# Patient Record
Sex: Female | Born: 1935 | Race: White | Hispanic: No | Smoking: Never smoker
Health system: Southern US, Community
[De-identification: ages and names within clinical notes are randomized; demographics above are authoritative.]

## PROBLEM LIST (undated history)

## (undated) DIAGNOSIS — C9111 Chronic lymphocytic leukemia of B-cell type in remission: Secondary | ICD-10-CM

## (undated) DIAGNOSIS — K219 Gastro-esophageal reflux disease without esophagitis: Secondary | ICD-10-CM

## (undated) DIAGNOSIS — R258 Other abnormal involuntary movements: Secondary | ICD-10-CM

## (undated) DIAGNOSIS — I2510 Atherosclerotic heart disease of native coronary artery without angina pectoris: Secondary | ICD-10-CM

## (undated) DIAGNOSIS — L720 Epidermal cyst: Secondary | ICD-10-CM

## (undated) DIAGNOSIS — R278 Other lack of coordination: Secondary | ICD-10-CM

## (undated) DIAGNOSIS — R600 Localized edema: Secondary | ICD-10-CM

## (undated) DIAGNOSIS — E119 Type 2 diabetes mellitus without complications: Secondary | ICD-10-CM

## (undated) DIAGNOSIS — F458 Other somatoform disorders: Secondary | ICD-10-CM

## (undated) DIAGNOSIS — F331 Major depressive disorder, recurrent, moderate: Secondary | ICD-10-CM

## (undated) DIAGNOSIS — I110 Hypertensive heart disease with heart failure: Secondary | ICD-10-CM

## (undated) DIAGNOSIS — R351 Nocturia: Secondary | ICD-10-CM

## (undated) DIAGNOSIS — C50919 Malignant neoplasm of unspecified site of unspecified female breast: Secondary | ICD-10-CM

## (undated) DIAGNOSIS — H409 Unspecified glaucoma: Secondary | ICD-10-CM

## (undated) DIAGNOSIS — E039 Hypothyroidism, unspecified: Secondary | ICD-10-CM

## (undated) DIAGNOSIS — R197 Diarrhea, unspecified: Secondary | ICD-10-CM

## (undated) DIAGNOSIS — L918 Other hypertrophic disorders of the skin: Secondary | ICD-10-CM

## (undated) DIAGNOSIS — L570 Actinic keratosis: Secondary | ICD-10-CM

## (undated) DIAGNOSIS — R350 Frequency of micturition: Secondary | ICD-10-CM

## (undated) DIAGNOSIS — M549 Dorsalgia, unspecified: Secondary | ICD-10-CM

## (undated) DIAGNOSIS — K649 Unspecified hemorrhoids: Secondary | ICD-10-CM

## (undated) DIAGNOSIS — E785 Hyperlipidemia, unspecified: Secondary | ICD-10-CM

## (undated) DIAGNOSIS — R21 Rash and other nonspecific skin eruption: Secondary | ICD-10-CM

## (undated) DIAGNOSIS — L299 Pruritus, unspecified: Secondary | ICD-10-CM

## (undated) DIAGNOSIS — I509 Heart failure, unspecified: Secondary | ICD-10-CM

## (undated) DIAGNOSIS — Z9181 History of falling: Secondary | ICD-10-CM

## (undated) DIAGNOSIS — R2231 Localized swelling, mass and lump, right upper limb: Secondary | ICD-10-CM

## (undated) DIAGNOSIS — K589 Irritable bowel syndrome without diarrhea: Secondary | ICD-10-CM

## (undated) DIAGNOSIS — K5900 Constipation, unspecified: Secondary | ICD-10-CM

## (undated) DIAGNOSIS — I82409 Acute embolism and thrombosis of unspecified deep veins of unspecified lower extremity: Secondary | ICD-10-CM

## (undated) DIAGNOSIS — I10 Essential (primary) hypertension: Secondary | ICD-10-CM

## (undated) DIAGNOSIS — C801 Malignant (primary) neoplasm, unspecified: Secondary | ICD-10-CM

## (undated) DIAGNOSIS — L509 Urticaria, unspecified: Secondary | ICD-10-CM

## (undated) DIAGNOSIS — E559 Vitamin D deficiency, unspecified: Secondary | ICD-10-CM

## (undated) DIAGNOSIS — H9190 Unspecified hearing loss, unspecified ear: Secondary | ICD-10-CM

## (undated) DIAGNOSIS — I4891 Unspecified atrial fibrillation: Secondary | ICD-10-CM

## (undated) DIAGNOSIS — R2681 Unsteadiness on feet: Secondary | ICD-10-CM

## (undated) DIAGNOSIS — B000 Eczema herpeticum: Secondary | ICD-10-CM

## (undated) DIAGNOSIS — O2230 Deep phlebothrombosis in pregnancy, unspecified trimester: Secondary | ICD-10-CM

## (undated) DIAGNOSIS — F419 Anxiety disorder, unspecified: Secondary | ICD-10-CM

## (undated) DIAGNOSIS — H8110 Benign paroxysmal vertigo, unspecified ear: Secondary | ICD-10-CM

## (undated) DIAGNOSIS — K588 Other irritable bowel syndrome: Secondary | ICD-10-CM

## (undated) HISTORY — PX: BREAST LUMPECTOMY: SHX2

## (undated) HISTORY — PX: APPENDECTOMY: SHX54

## (undated) HISTORY — PX: EYE SURGERY: SHX253

---

## 2012-01-08 ENCOUNTER — Encounter (HOSPITAL_BASED_OUTPATIENT_CLINIC_OR_DEPARTMENT_OTHER): Payer: Self-pay | Admitting: *Deleted

## 2012-01-08 ENCOUNTER — Emergency Department (HOSPITAL_BASED_OUTPATIENT_CLINIC_OR_DEPARTMENT_OTHER)
Admission: EM | Admit: 2012-01-08 | Discharge: 2012-01-08 | Disposition: A | Discharge status: Home / Self Care | Payer: Medicare Other | Attending: Emergency Medicine | Admitting: Emergency Medicine

## 2012-01-08 ENCOUNTER — Other Ambulatory Visit: Payer: Self-pay

## 2012-01-08 DIAGNOSIS — I10 Essential (primary) hypertension: Secondary | ICD-10-CM | POA: Insufficient documentation

## 2012-01-08 DIAGNOSIS — E785 Hyperlipidemia, unspecified: Secondary | ICD-10-CM | POA: Insufficient documentation

## 2012-01-08 DIAGNOSIS — Z79899 Other long term (current) drug therapy: Secondary | ICD-10-CM | POA: Insufficient documentation

## 2012-01-08 DIAGNOSIS — Z853 Personal history of malignant neoplasm of breast: Secondary | ICD-10-CM | POA: Insufficient documentation

## 2012-01-08 DIAGNOSIS — R42 Dizziness and giddiness: Secondary | ICD-10-CM | POA: Insufficient documentation

## 2012-01-08 DIAGNOSIS — R112 Nausea with vomiting, unspecified: Secondary | ICD-10-CM | POA: Insufficient documentation

## 2012-01-08 DIAGNOSIS — I2510 Atherosclerotic heart disease of native coronary artery without angina pectoris: Secondary | ICD-10-CM | POA: Insufficient documentation

## 2012-01-08 HISTORY — DX: Malignant neoplasm of unspecified site of unspecified female breast: C50.919

## 2012-01-08 HISTORY — DX: Essential (primary) hypertension: I10

## 2012-01-08 HISTORY — DX: Hyperlipidemia, unspecified: E78.5

## 2012-01-08 HISTORY — DX: Atherosclerotic heart disease of native coronary artery without angina pectoris: I25.10

## 2012-01-08 HISTORY — DX: Malignant (primary) neoplasm, unspecified: C80.1

## 2012-01-08 LAB — URINALYSIS, ROUTINE W REFLEX MICROSCOPIC
Nitrite: NEGATIVE
Protein, ur: NEGATIVE mg/dL
Specific Gravity, Urine: 1.014 (ref 1.005–1.030)
Urobilinogen, UA: 0.2 mg/dL (ref 0.0–1.0)

## 2012-01-08 LAB — CBC
Platelets: 153 10*3/uL (ref 150–400)
RDW: 12.8 % (ref 11.5–15.5)
WBC: 10.9 10*3/uL — ABNORMAL HIGH (ref 4.0–10.5)

## 2012-01-08 LAB — COMPREHENSIVE METABOLIC PANEL
ALT: 16 U/L (ref 0–35)
BUN: 25 mg/dL — ABNORMAL HIGH (ref 6–23)
CO2: 28 mEq/L (ref 19–32)
Calcium: 9.6 mg/dL (ref 8.4–10.5)
Creatinine, Ser: 0.6 mg/dL (ref 0.50–1.10)
GFR calc Af Amer: >90 mL/min (ref >90)
GFR calc non Af Amer: 81 mL/min — ABNORMAL LOW (ref >90)
Glucose, Bld: 233 mg/dL — ABNORMAL HIGH (ref 70–99)
Total Protein: 6.9 g/dL (ref 6.0–8.3)

## 2012-01-08 LAB — LIPASE, BLOOD: Lipase: 17 U/L (ref 11–59)

## 2012-01-08 LAB — URINE MICROSCOPIC-ADD ON

## 2012-01-08 LAB — TROPONIN I: Troponin I: <0.3 ng/mL (ref <0.30)

## 2012-01-08 MED ORDER — ONDANSETRON HCL 4 MG PO TABS: 8.0000 mg | ORAL_TABLET | Freq: Four times a day (QID) | ORAL | Status: AC

## 2012-01-08 MED ORDER — ONDANSETRON HCL 4 MG/2ML IJ SOLN
4.0000 mg | Freq: Once | INTRAMUSCULAR | Status: AC
  Administered 2012-01-08: 4 mg via INTRAVENOUS
  Filled 2012-01-08: qty 2

## 2012-01-08 NOTE — ED Provider Notes (Signed)
History     CSN: [id number]  Arrival date & time 01/08/12  0356   First MD Initiated Contact with Patient 01/08/12 [phone]      Chief Complaint  Patient presents with  . Nausea  . Dizziness    (Consider location/radiation/quality/duration/timing/severity/associated sxs/prior treatment) The history is provided by the patient.   the patient reports development of acute onset nausea and nonbloody nonbilious vomiting.  She denies chest pain or shortness of breath.  She denies abdominal pain.  She denies diarrhea.  She's had no fever or chills.  She reports these symptoms occurred abruptly.  She vomited twice.  She reports she got up to go the bathroom and felt "dizzy".  She is unable to describe this sensation further.  She was given Zofran on arrival the emergency department she reports her nausea significantly improved at this time.  Nothing worsens her symptoms.  Nothing improves her symptoms.  Her symptoms are constant.  They're currently improving.  Her symptoms are now mild in severity  Past Medical History  Diagnosis Date  . Hypertension   . Hyperlipemia   . Coronary artery disease   . Glaucoma   . Cancer   . Breast CA     Past Surgical History  Procedure Date  . Breast lumpectomy   . Appendectomy   . Eye surgery     History reviewed. No pertinent family history.  History  Substance Use Topics  . Smoking status: Never Smoker   . Smokeless tobacco: Not on file  . Alcohol Use: No    OB History    Grav Para Term Preterm Abortions TAB SAB Ect Mult Living                  Review of Systems  All other systems reviewed and are negative.    Allergies  Review of patient's allergies indicates no known allergies.  Home Medications   Current Outpatient Rx  Name Route Sig Dispense Refill  . ASPIRIN 81 MG PO TABS Oral Take 160 mg by mouth daily.    Marland Kitchen CALCIUM CITRATE-VITAMIN D 315-200 MG-UNIT PO TABS Oral Take 1 tablet by mouth 2 (two) times daily.    Marland Kitchen LATANOPROST  0.005 % OP SOLN  1 drop at bedtime.    Marland Kitchen METOPROLOL TARTRATE 50 MG PO TABS Oral Take 50 mg by mouth 2 (two) times daily.    . CENTRUM PO CHEW Oral Chew 1 tablet by mouth daily.    Marland Kitchen PRAVASTATIN SODIUM 40 MG PO TABS Oral Take 40 mg by mouth daily.    Marland Kitchen TIMOLOL HEMIHYDRATE 0.5 % OP SOLN  1 drop 2 (two) times daily.      BP 172/99  Pulse 86  Temp(Src) 96.6 F (35.9 C) (Rectal)  Resp 20  SpO2 100%  Physical Exam  Nursing note and vitals reviewed. Constitutional: She is oriented to person, place, and time. She appears well-developed and well-nourished.  HENT:  Head: Normocephalic and atraumatic.  Eyes: Pupils are equal, round, and reactive to light.  Cardiovascular: Regular rhythm.   Pulmonary/Chest: Effort normal.  Abdominal: Soft.  Neurological: She is alert and oriented to person, place, and time.       5/5 strength in major muscle groups of  bilateral upper and lower extremities. Speech normal. No facial asymetry.  Normal finger to nose bilaterally.  No pronator drift.    ED Course  Procedures (including critical care time)   Date: 01/08/2012  Rate: 78  Rhythm: normal sinus rhythm  QRS Axis: normal  Intervals: normal  ST/T Wave abnormalities: nonspecific T wave changes  Conduction Disutrbances:none  Narrative Interpretation:   Old EKG Reviewed: No prior ecg available   Labs Reviewed  CBC - Abnormal; Notable for the following:    WBC 10.9 (*)    Hemoglobin 15.6 (*)    All other components within normal limits  COMPREHENSIVE METABOLIC PANEL - Abnormal; Notable for the following:    Glucose, Bld 233 (*)    BUN 25 (*)    GFR calc non Af Amer 81 (*)    All other components within normal limits  URINALYSIS, ROUTINE W REFLEX MICROSCOPIC - Abnormal; Notable for the following:    Glucose, UA 100 (*)    Hgb urine dipstick TRACE (*)    Ketones, ur 15 (*)    All other components within normal limits  URINE MICROSCOPIC-ADD ON - Abnormal; Notable for the following:     Bacteria, UA FEW (*)    All other components within normal limits  LIPASE, BLOOD  TROPONIN I   No results found.   1. Nausea and vomiting       MDM  Her abdomen is benign.  Her EKG is normal.  I doubt this is an atypical presentation for ACS.  She has no headache at this time and a normal neurologic exam.  My suspicion for stroke, both embolic and hemorrhagic is low.  She feels significantly improved after Zofran.  I will obtain baseline labs and urine sample.  She denies dysuria and urinary frequency.  5:27 AM The patient feels much better at this time.  We'll discharge home in good condition.  She's instructed to call her physician later today if her symptoms continue.             Lyanne Co, MD 01/08/12 [phone]

## 2012-01-08 NOTE — ED Notes (Signed)
Per EMS pt with N/V and dizziness began this am while in bed

## 2012-01-08 NOTE — ED Notes (Signed)
Pt ambulated to BR without difficulty given PO fluids

## 2018-01-18 ENCOUNTER — Encounter (HOSPITAL_BASED_OUTPATIENT_CLINIC_OR_DEPARTMENT_OTHER): Payer: Self-pay | Admitting: Emergency Medicine

## 2018-01-18 ENCOUNTER — Emergency Department (HOSPITAL_BASED_OUTPATIENT_CLINIC_OR_DEPARTMENT_OTHER): Payer: Medicare Other

## 2018-01-18 ENCOUNTER — Emergency Department (HOSPITAL_BASED_OUTPATIENT_CLINIC_OR_DEPARTMENT_OTHER)
Admission: EM | Admit: 2018-01-18 | Discharge: 2018-01-18 | Disposition: A | Discharge status: Home / Self Care | Payer: Medicare Other | Attending: Emergency Medicine | Admitting: Emergency Medicine

## 2018-01-18 DIAGNOSIS — Y939 Activity, unspecified: Secondary | ICD-10-CM | POA: Insufficient documentation

## 2018-01-18 DIAGNOSIS — S81812A Laceration without foreign body, left lower leg, initial encounter: Secondary | ICD-10-CM

## 2018-01-18 DIAGNOSIS — C9111 Chronic lymphocytic leukemia of B-cell type in remission: Secondary | ICD-10-CM | POA: Insufficient documentation

## 2018-01-18 DIAGNOSIS — Y92199 Unspecified place in other specified residential institution as the place of occurrence of the external cause: Secondary | ICD-10-CM | POA: Insufficient documentation

## 2018-01-18 DIAGNOSIS — E119 Type 2 diabetes mellitus without complications: Secondary | ICD-10-CM | POA: Diagnosis not present

## 2018-01-18 DIAGNOSIS — Z853 Personal history of malignant neoplasm of breast: Secondary | ICD-10-CM | POA: Insufficient documentation

## 2018-01-18 DIAGNOSIS — S0083XA Contusion of other part of head, initial encounter: Secondary | ICD-10-CM

## 2018-01-18 DIAGNOSIS — W0110XA Fall on same level from slipping, tripping and stumbling with subsequent striking against unspecified object, initial encounter: Secondary | ICD-10-CM | POA: Diagnosis not present

## 2018-01-18 DIAGNOSIS — Y999 Unspecified external cause status: Secondary | ICD-10-CM | POA: Insufficient documentation

## 2018-01-18 DIAGNOSIS — Z79899 Other long term (current) drug therapy: Secondary | ICD-10-CM | POA: Insufficient documentation

## 2018-01-18 DIAGNOSIS — I10 Essential (primary) hypertension: Secondary | ICD-10-CM | POA: Diagnosis not present

## 2018-01-18 DIAGNOSIS — W19XXXA Unspecified fall, initial encounter: Secondary | ICD-10-CM

## 2018-01-18 DIAGNOSIS — S80922A Unspecified superficial injury of left lower leg, initial encounter: Secondary | ICD-10-CM | POA: Diagnosis not present

## 2018-01-18 DIAGNOSIS — S0990XA Unspecified injury of head, initial encounter: Secondary | ICD-10-CM | POA: Diagnosis present

## 2018-01-18 DIAGNOSIS — Z7901 Long term (current) use of anticoagulants: Secondary | ICD-10-CM | POA: Diagnosis not present

## 2018-01-18 HISTORY — DX: Other irritable bowel syndrome: K58.8

## 2018-01-18 HISTORY — DX: Eczema herpeticum: B00.0

## 2018-01-18 HISTORY — DX: Gastro-esophageal reflux disease without esophagitis: K21.9

## 2018-01-18 HISTORY — DX: Actinic keratosis: L57.0

## 2018-01-18 HISTORY — DX: Atherosclerotic heart disease of native coronary artery without angina pectoris: I25.10

## 2018-01-18 HISTORY — DX: Urticaria, unspecified: L50.9

## 2018-01-18 HISTORY — DX: Other hypertrophic disorders of the skin: L91.8

## 2018-01-18 HISTORY — DX: Heart failure, unspecified: I50.9

## 2018-01-18 HISTORY — DX: Irritable bowel syndrome, unspecified: K58.9

## 2018-01-18 HISTORY — DX: History of falling: Z91.81

## 2018-01-18 HISTORY — DX: Type 2 diabetes mellitus without complications: E11.9

## 2018-01-18 HISTORY — DX: Major depressive disorder, recurrent, moderate: F33.1

## 2018-01-18 HISTORY — DX: Dorsalgia, unspecified: M54.9

## 2018-01-18 HISTORY — DX: Vitamin D deficiency, unspecified: E55.9

## 2018-01-18 HISTORY — DX: Other lack of coordination: R27.8

## 2018-01-18 HISTORY — DX: Acute embolism and thrombosis of unspecified deep veins of unspecified lower extremity: I82.409

## 2018-01-18 HISTORY — DX: Chronic lymphocytic leukemia of B-cell type in remission: C91.11

## 2018-01-18 HISTORY — DX: Benign paroxysmal vertigo, unspecified ear: H81.10

## 2018-01-18 HISTORY — DX: Unspecified hemorrhoids: K64.9

## 2018-01-18 HISTORY — DX: Unsteadiness on feet: R26.81

## 2018-01-18 HISTORY — DX: Anxiety disorder, unspecified: F41.9

## 2018-01-18 HISTORY — DX: Nocturia: R35.1

## 2018-01-18 HISTORY — DX: Hypertensive heart disease with heart failure: I11.0

## 2018-01-18 HISTORY — DX: Constipation, unspecified: K59.00

## 2018-01-18 HISTORY — DX: Localized edema: R60.0

## 2018-01-18 HISTORY — DX: Unspecified atrial fibrillation: I48.91

## 2018-01-18 HISTORY — DX: Frequency of micturition: R35.0

## 2018-01-18 HISTORY — DX: Epidermal cyst: L72.0

## 2018-01-18 HISTORY — DX: Localized swelling, mass and lump, right upper limb: R22.31

## 2018-01-18 HISTORY — DX: Other somatoform disorders: F45.8

## 2018-01-18 HISTORY — DX: Deep phlebothrombosis in pregnancy, unspecified trimester: O22.30

## 2018-01-18 HISTORY — DX: Diarrhea, unspecified: R19.7

## 2018-01-18 HISTORY — DX: Rash and other nonspecific skin eruption: R21

## 2018-01-18 HISTORY — DX: Other abnormal involuntary movements: R25.8

## 2018-01-18 HISTORY — DX: Hypothyroidism, unspecified: E03.9

## 2018-01-18 HISTORY — DX: Unspecified glaucoma: H40.9

## 2018-01-18 HISTORY — DX: Unspecified hearing loss, unspecified ear: H91.90

## 2018-01-18 HISTORY — DX: Pruritus, unspecified: L29.9

## 2018-01-18 NOTE — ED Notes (Signed)
Per EMS:  unwitnessed fall at [facility].  Pt is on blood thinners and had hematoma to forehead.  Staff requested our facility for CT scan

## 2018-01-18 NOTE — ED Triage Notes (Signed)
Pt arrives EMS from [facility]; pt reports hurrying to get to the bathroom, tripping, and falling. Pt has pain to the back of the head. Also noted that pt has hematoma to right forehead; pt states that is from a previous fall. Pt alert and oriented; PERRLA.

## 2018-01-18 NOTE — ED Provider Notes (Signed)
[facility] EMERGENCY DEPARTMENT Provider Note   CSN: [id number] Arrival date & time: 01/18/18  1302     History   Chief Complaint Chief Complaint  Patient presents with  . Fall    HPI Stacy Solis is a 84 y.o. female.  HPI  84 year old female with a history of atrial fibrillation who is currently on Eliquis presents after a fall.  She states she falls often.  She was going to the bathroom trying to have a bowel movement when she reached with her hand and missed.  She states she hit her forehead and also is having posterior neck pain.  She has a small abrasion to her left lower extremity but denies pain.  She denies significant pain at this time besides the mild neck and forehead pain.  No weakness or numbness.  She denies dizziness or chest pain or shortness of breath.  She states she was not dizzy when she fell but just missed grabbing a rail.  Past Medical History:  Diagnosis Date  . Actinic keratosis   . Anxiety hyperventilation   . Atrial fibrillation ([place])   . Avitaminosis D   . Benign paroxysmal positional vertigo   . Breast CA ([place])   . Cancer ([place])   . Chronic lymphoid leukemia in remission ([place])   . CN (constipation)   . Coronary artery disease   . Coronary atherosclerosis of native coronary artery   . Deep phlebothrombosis, antepartum, with delivery ([place])   . Diabetes mellitus without complication ([place])   . Diarrhea of presumed infectious origin   . Dorsalgia   . Eczema herpeticum   . Epithelial inclusion cyst   . Esophageal reflux   . Fibroepithelial polyp   . Fibroepithelial polyp   . Glaucoma   . Glaucoma syndrome   . Heart failure, unspecified ([place])   . Hemorrhoids without complication   . Hyperlipemia   . Hypertension   . Hypertensive heart disease with congestive heart failure ([place])   . Irritable bowel syndrome   . Localized edema   . Major depressive disorder, recurrent episode, moderate ([place])   . Mass of finger of right hand   .  Nocturia   . Other irritable bowel syndrome   . Personal history of fall   . Posthemiplegic athetosis   . Primary hypothyroidism   . Problems with hearing   . Pruritus of skin   . Pruritus of skin   . Rash and other nonspecific skin eruption   . Unsteady gait   . Urinary frequency   . Urticaria, unspecified     Patient Active Problem List   Diagnosis Date Noted  . Hyperlipemia     Past Surgical History:  Procedure Laterality Date  . APPENDECTOMY    . BREAST LUMPECTOMY    . EYE SURGERY      OB History    No data available       Home Medications    Prior to Admission medications   Medication Sig Start Date End Date Taking? Authorizing Provider  acetaminophen (TYLENOL) 325 MG tablet Take 650 mg by mouth every 6 (six) hours as needed.   Yes [provider]  apixaban (ELIQUIS) 5 MG TABS tablet Take 5 mg by mouth 2 (two) times daily.   Yes [provider]  camphor-menthol Timoteo Ace) lotion Apply 1 application topically as needed for itching.   Yes [provider]  cetaphil (CETAPHIL) cream Apply topically as needed.   Yes [provider]  cholestyramine (  QUESTRAN) 4 g packet Take 4 g by mouth 3 (three) times daily with meals.   Yes [provider]  dicyclomine (BENTYL) 10 MG capsule Take 10 mg by mouth 4 (four) times daily -  before meals and at bedtime.   Yes [provider]  digoxin (LANOXIN) 0.125 MG tablet Take by mouth daily.   Yes [provider]  ergocalciferol (VITAMIN D2) 50000 units capsule Take 50,000 Units by mouth once a week.   Yes [provider]  hydroxypropyl methylcellulose / hypromellose (ISOPTO TEARS / GONIOVISC) 2.5 % ophthalmic solution 1 drop.   Yes [provider]  latanoprost (XALATAN) 0.005 % ophthalmic solution 1 drop at bedtime.   Yes [provider]  levothyroxine (SYNTHROID, LEVOTHROID) 25 MCG tablet Take 25 mcg by mouth daily before breakfast.   Yes [provider]  loperamide (IMODIUM) 1 MG/5ML solution Take 2 mg by mouth as needed for diarrhea or loose stools.   Yes [provider]  metoprolol succinate (TOPROL-XL) 25 MG 24 hr tablet Take 25 mg by mouth daily.   Yes [provider]  Multiple Vitamins-Minerals (ICAPS AREDS 2) CAPS Take 1 capsule by mouth 2 (two) times daily.   Yes [provider]  polyethylene glycol (MIRALAX / GLYCOLAX) packet Take 17 g by mouth daily.   Yes [provider]  psyllium (METAMUCIL) 58.6 % packet Take 1 packet by mouth daily.   Yes [provider]  sertraline (ZOLOFT) 50 MG tablet Take 50 mg by mouth daily.   Yes [provider]  spironolactone (ALDACTONE) 25 MG tablet Take 25 mg by mouth daily.   Yes [provider]  triamcinolone cream (KENALOG) 0.5 % Apply 1 application topically 3 (three) times daily.   Yes [provider]    Family History No family history on file.  Social History Social History   Tobacco Use  . Smoking status: Never Smoker  Substance Use Topics  . Alcohol use: No  . Drug use: No     Allergies   Patient has no known allergies.   Review of Systems Review of Systems  Respiratory: Negative for shortness of breath.   Cardiovascular: Negative for chest pain.  Gastrointestinal: Negative for vomiting.  Musculoskeletal: Positive for neck pain.  Neurological: Positive for headaches. Negative for weakness.  All other systems reviewed and are negative.    Physical Exam Updated Vital Signs BP (!) 120/57 (BP Location: Left Arm)   Pulse 66   Temp 98.3 F (36.8 C) (Oral)   Resp 19   SpO2 98%   Physical Exam  Constitutional: She appears well-developed and well-nourished.  HENT:  Head: Normocephalic. Head is with contusion.    Right Ear: External ear normal.  Left Ear: External ear normal.  Nose: Nose normal.  Eyes: Right eye exhibits no discharge. Left eye exhibits no discharge.  Neck: No  spinous process tenderness and no muscular tenderness present.  Cardiovascular: Normal rate, regular rhythm and normal heart sounds.  Pulmonary/Chest: Effort normal and breath sounds normal.  Abdominal: Soft. There is no tenderness.  Musculoskeletal:       Right hip: She exhibits normal range of motion and no tenderness.       Left hip: She exhibits normal range of motion and no tenderness.       Legs: Neurological: She is alert.  Alert, oriented to person, place and situation. CN 3-12 grossly intact. 5/5 strength in all 4 extremities. Grossly normal sensation. Normal finger to nose.  Skin: Skin is warm and dry.  Nursing note and vitals reviewed.    ED Treatments / Results  Labs (all labs ordered are listed, but only abnormal results are displayed) Labs Reviewed - No data to display  EKG  EKG Interpretation None       Radiology Ct Head Wo Contrast  Result Date: 01/18/2018 CLINICAL DATA:  Fall, head injury, right frontal bruising. Occipital and posterior neck pain. On blood thinners. EXAM: CT HEAD WITHOUT CONTRAST CT CERVICAL SPINE WITHOUT CONTRAST TECHNIQUE: Multidetector CT imaging of the head and cervical spine was performed following the standard protocol without intravenous contrast. Multiplanar CT image reconstructions of the cervical spine were also generated. COMPARISON:  None. FINDINGS: CT HEAD FINDINGS Brain: There is atrophy and chronic small vessel disease changes. No acute intracranial abnormality. Specifically, no hemorrhage, hydrocephalus, mass lesion, acute infarction, or significant intracranial injury. Vascular: No hyperdense vessel or unexpected calcification. Skull: No acute calvarial abnormality. Sinuses/Orbits: Visualized paranasal sinuses and mastoids clear. Orbital soft tissues unremarkable. Other: None CT CERVICAL SPINE FINDINGS Alignment: 3 mm anterolisthesis of C4 on C5 and C5 on C6 as well as C7 on T1, related to facet disease. Skull base and vertebrae: No  fracture Soft tissues and spinal canal: Prevertebral soft tissues are normal. No epidural or paraspinal hematoma. Disc levels: Diffuse degenerative disc disease, most pronounced at C6-7. Diffuse bilateral degenerative facet disease. Upper chest: No acute findings.  Biapical scarring. Other: No acute findings. IMPRESSION: Extensive chronic small vessel disease. Diffuse cerebral atrophy. No acute intracranial abnormality. Degenerative disc and facet disease with multilevel grade 1 anterolisthesis due to facet disease. No acute bony abnormality. Electronically Signed   By: Rolm Baptise M.D.   On: 01/18/2018 14:14   Ct Cervical Spine Wo Contrast  Result Date: 01/18/2018 CLINICAL DATA:  Fall, head injury, right frontal bruising. Occipital and posterior neck pain. On blood thinners. EXAM: CT HEAD WITHOUT CONTRAST CT CERVICAL SPINE WITHOUT CONTRAST TECHNIQUE: Multidetector CT imaging of the head and cervical spine was performed following the standard protocol without intravenous contrast. Multiplanar CT image reconstructions of the cervical spine were also generated. COMPARISON:  None. FINDINGS: CT HEAD FINDINGS Brain: There is atrophy and chronic small vessel disease changes. No acute intracranial abnormality. Specifically, no hemorrhage, hydrocephalus, mass lesion, acute infarction, or significant intracranial injury. Vascular: No hyperdense vessel or unexpected calcification. Skull: No acute calvarial abnormality. Sinuses/Orbits: Visualized paranasal sinuses and mastoids clear. Orbital soft tissues unremarkable. Other: None CT CERVICAL SPINE FINDINGS Alignment: 3 mm anterolisthesis of C4 on C5 and C5 on C6 as well as C7 on T1, related to facet disease. Skull base and vertebrae: No fracture Soft tissues and spinal canal: Prevertebral soft tissues are normal. No epidural or paraspinal hematoma. Disc levels: Diffuse degenerative disc disease, most pronounced at C6-7. Diffuse bilateral degenerative facet disease. Upper  chest: No acute findings.  Biapical scarring. Other: No acute findings. IMPRESSION: Extensive chronic small vessel disease. Diffuse cerebral atrophy. No acute intracranial abnormality. Degenerative disc and facet disease with multilevel grade 1 anterolisthesis due to facet disease. No acute bony abnormality. Electronically Signed   By: Rolm Baptise M.D.   On: 01/18/2018 14:14    Procedures Procedures (including critical care time)  Medications Ordered in ED Medications - No data to display   Initial Impression / Assessment and Plan / ED Course  I have reviewed the triage vital signs and the nursing notes.  Pertinent labs & imaging results that were available during my care of the patient were  reviewed by me and considered in my medical decision making (see chart for details).     Patient CT scans are unremarkable for acute emergent process.  She has a small skin tear on her lower leg but no surrounding bony tenderness or swelling.  At this point there does not appear to be a significant injury from her fall.  It sounds like a mechanical fall.  She will be discharged back to her nursing facility with return precautions.  Final Clinical Impressions(s) / ED Diagnoses   Final diagnoses:  Fall, initial encounter  Contusion of forehead, initial encounter  Skin tear of left lower leg without complication, initial encounter    ED Discharge Orders    None       Sherwood Gambler, MD 01/18/18 1535

## 2018-01-18 NOTE — ED Notes (Signed)
Patient back from CT.

## 2018-01-18 NOTE — ED Notes (Signed)
Ptar given yellow DNR form

## 2018-01-18 NOTE — ED Notes (Signed)
Patient transported to CT 

## 2018-03-07 DEATH — deceased

## 2018-07-31 IMAGING — CT CT CERVICAL SPINE W/O CM
3 of 7 series · 13 of 33 positions shown, 15 images · non-contrast
Comparison: None.

CLINICAL DATA: Fall, head injury, right frontal bruising. Occipital
and posterior neck pain. On blood thinners.

EXAM:
CT HEAD WITHOUT CONTRAST
CT CERVICAL SPINE WITHOUT CONTRAST
TECHNIQUE: Multidetector CT imaging of the head and cervical spine was
performed following the standard protocol without intravenous
contrast. Multiplanar CT image reconstructions of the cervical spine
were also generated.

[Series 4: head 3.0 mpr cor · coronal · 0.31mm/px · 3 of 67 slices shown]
[im 5/67  bone]
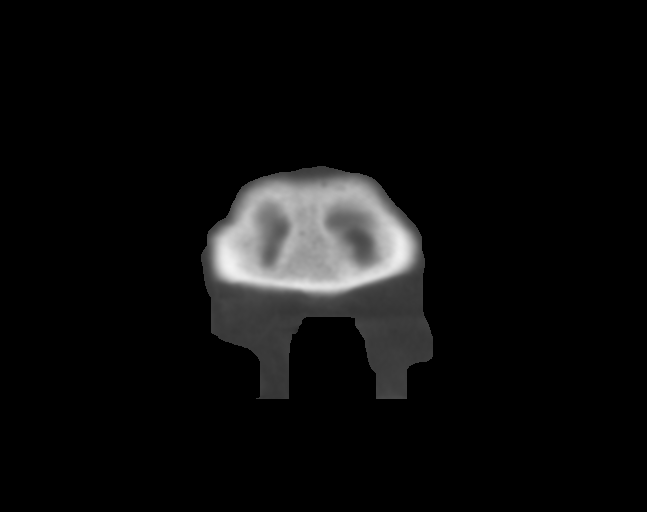
[im 10/67  bone]
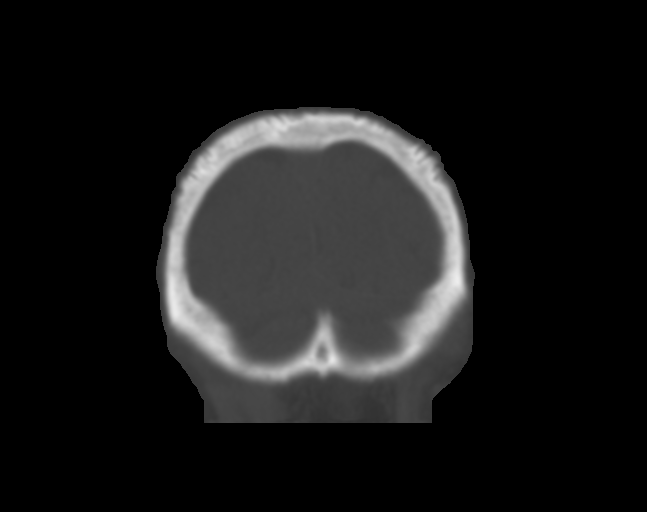
[im 14/67  bone]
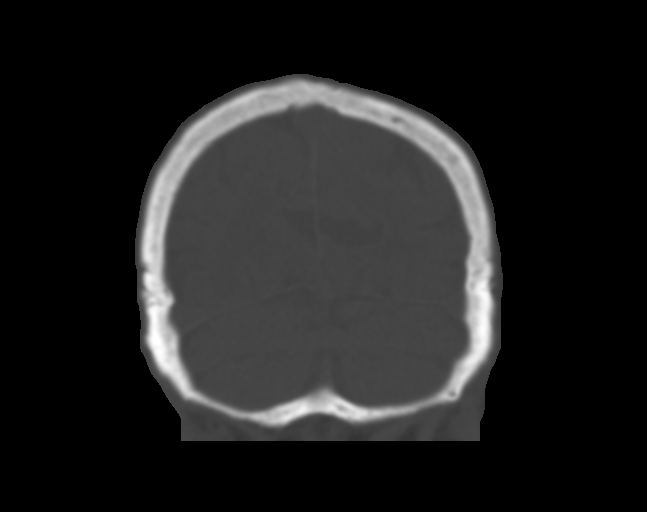

[Series 10: sagittals · sagittal · 0.29mm/px · 5 of 73 slices shown]
[im 13/73  bone]
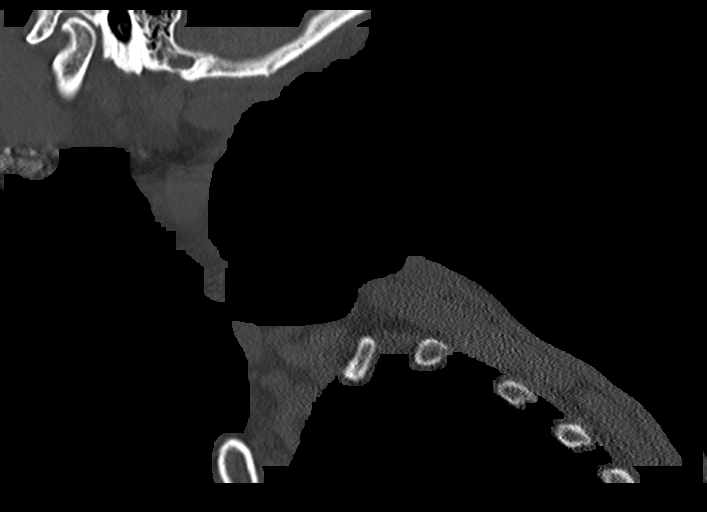
[im 25/73  bone]
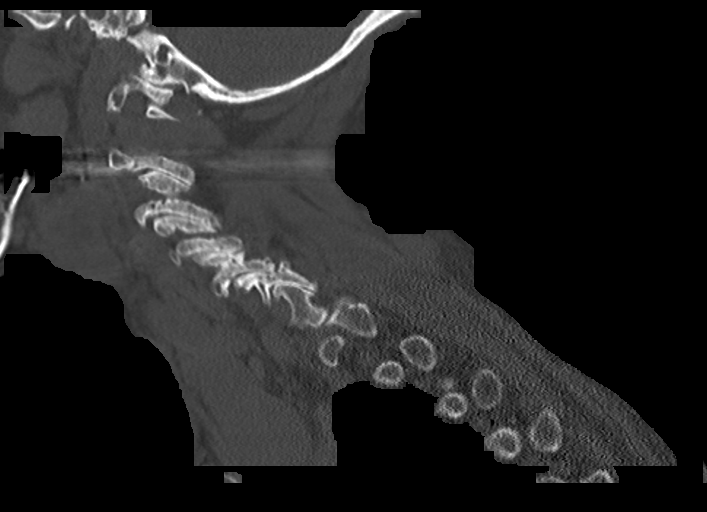
[im 37/73  bone]
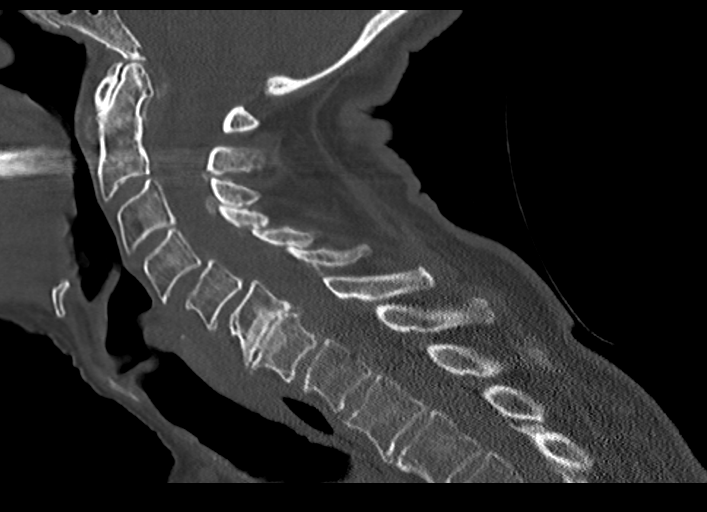
[im 49/73  bone]
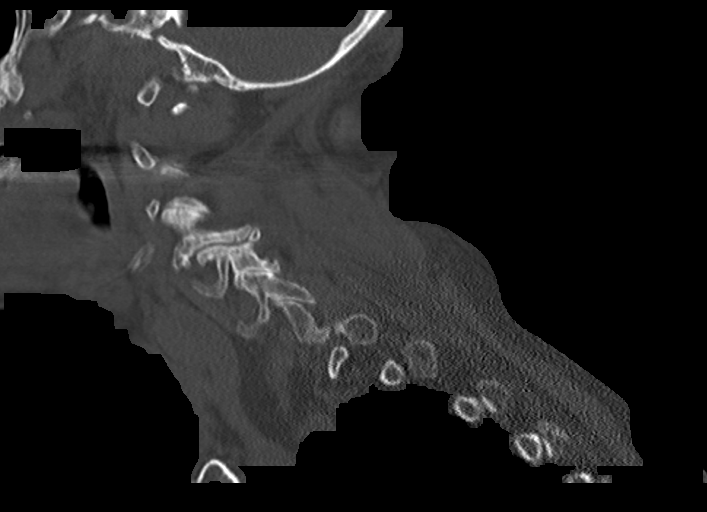
[im 61/73  bone]
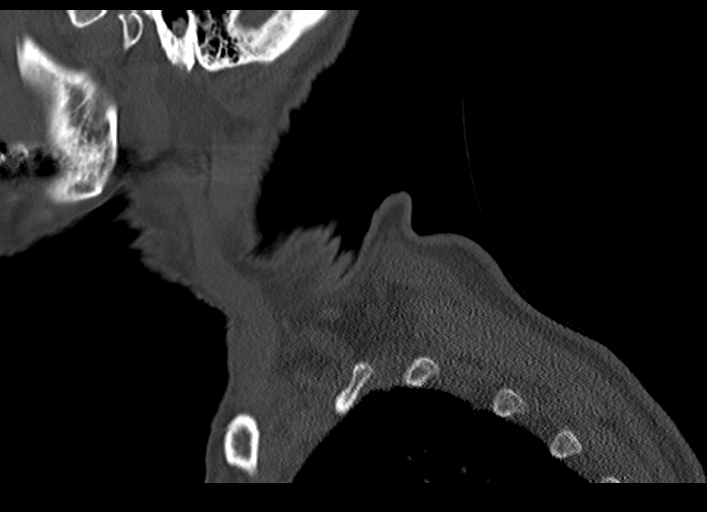

[Series 11: orthogonals · axial · 0.23mm/px · z∈[+994,+1101]mm · 5 of 93 slices shown, 7 images]
[im 16/93  soft-tissue]
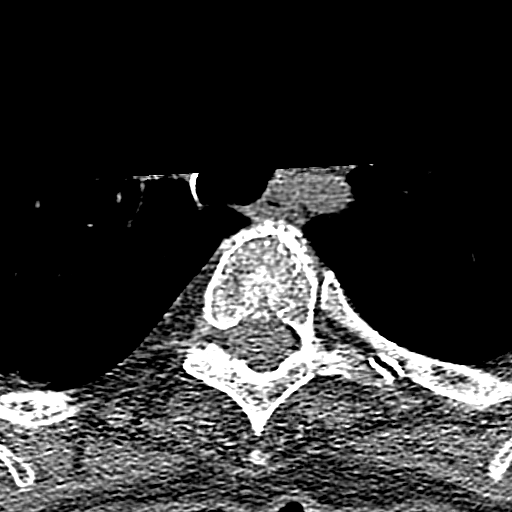
[im 16/93  bone]
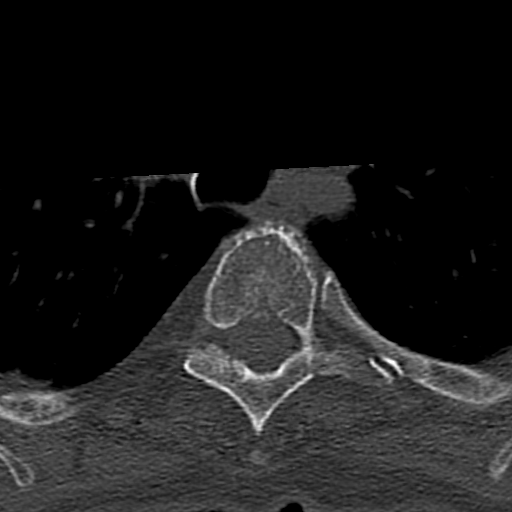
[im 31/93  bone]
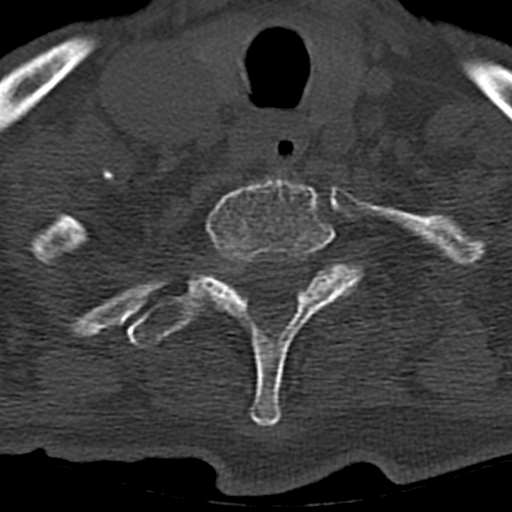
[im 47/93  bone]
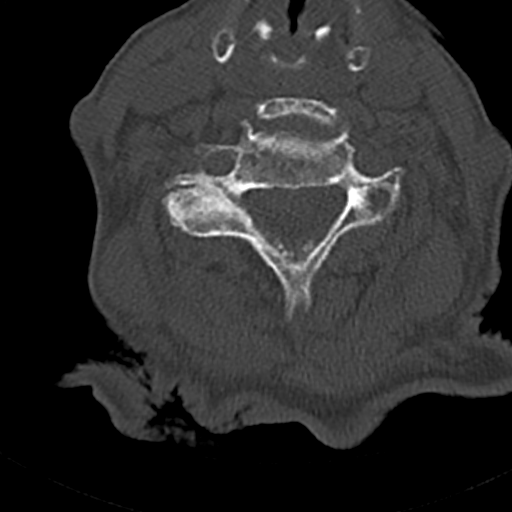
[im 62/93  bone]
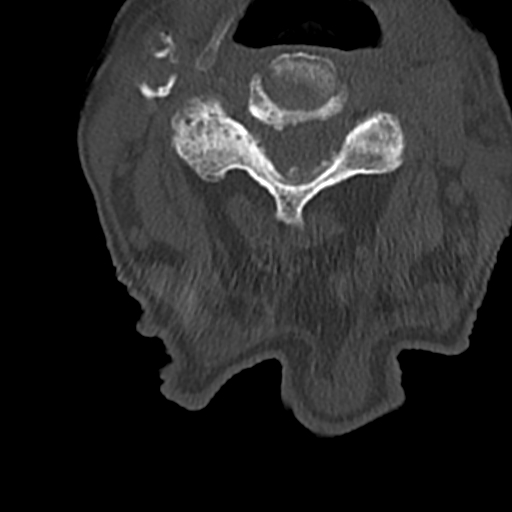
[im 77/93  soft-tissue]
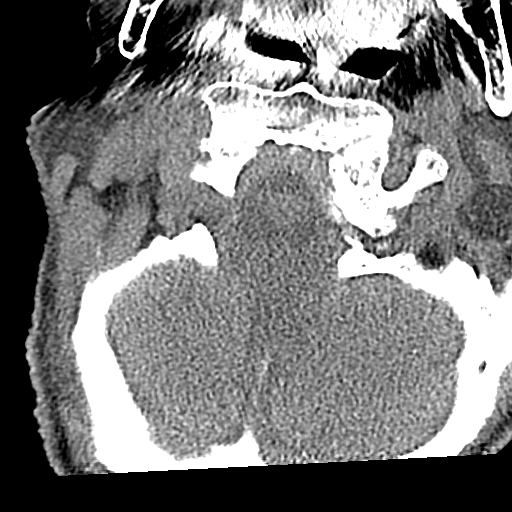
[im 77/93  bone]
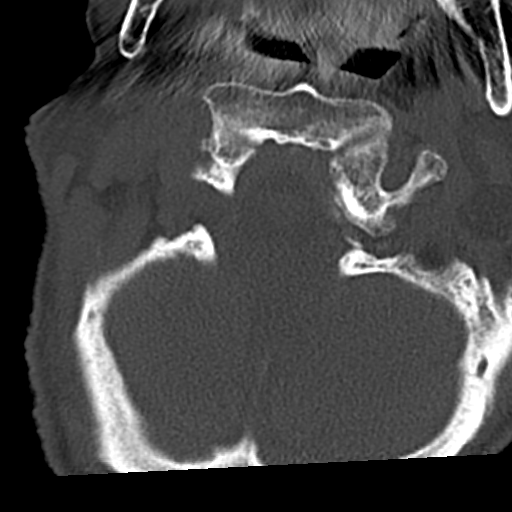

[13 of 33 positions shown; findings below may reference images not displayed]

FINDINGS: CT HEAD FINDINGS

Brain: There is atrophy and chronic small vessel disease changes. No
acute intracranial abnormality. Specifically, no hemorrhage,
hydrocephalus, mass lesion, acute infarction, or significant
intracranial injury.

Vascular: No hyperdense vessel or unexpected calcification.

Skull: No acute calvarial abnormality.

Sinuses/Orbits: Visualized paranasal sinuses and mastoids clear.
Orbital soft tissues unremarkable.

Other: None

CT CERVICAL SPINE FINDINGS

Alignment: 3 mm anterolisthesis of C4 on C5 and C5 on C6 as well as
C7 on T1, related to facet disease.

Skull base and vertebrae: No fracture

Soft tissues and spinal canal: Prevertebral soft tissues are normal.
No epidural or paraspinal hematoma.

Disc levels: Diffuse degenerative disc disease, most pronounced at
C6-7. Diffuse bilateral degenerative facet disease.

Upper chest: No acute findings.  Biapical scarring.

Other: No acute findings.
IMPRESSION: Extensive chronic small vessel disease. Diffuse cerebral atrophy. No
acute intracranial abnormality.

Degenerative disc and facet disease with multilevel grade 1
anterolisthesis due to facet disease. No acute bony abnormality.
